# Patient Record
Sex: Female | Born: 1975 | Race: White | Hispanic: No | Marital: Single | Smoking: Current some day smoker
Health system: Southern US, Community
[De-identification: ages and names within clinical notes are randomized; demographics above are authoritative.]

## PROBLEM LIST (undated history)

## (undated) HISTORY — PX: TUBAL LIGATION: SHX77

---

## 2020-03-10 ENCOUNTER — Encounter (HOSPITAL_COMMUNITY): Payer: Self-pay | Admitting: Family Medicine

## 2020-03-10 ENCOUNTER — Ambulatory Visit (HOSPITAL_COMMUNITY)
Admission: EM | Admit: 2020-03-10 | Discharge: 2020-03-10 | Disposition: A | Discharge status: Home / Self Care | Payer: Self-pay | Attending: Family Medicine | Admitting: Family Medicine

## 2020-03-10 ENCOUNTER — Other Ambulatory Visit: Payer: Self-pay

## 2020-03-10 DIAGNOSIS — H6122 Impacted cerumen, left ear: Secondary | ICD-10-CM

## 2020-03-10 DIAGNOSIS — F419 Anxiety disorder, unspecified: Secondary | ICD-10-CM

## 2020-03-10 DIAGNOSIS — N83209 Unspecified ovarian cyst, unspecified side: Secondary | ICD-10-CM

## 2020-03-10 MED ORDER — HYDROXYZINE HCL 25 MG PO TABS: 25.0000 mg | ORAL_TABLET | Freq: Four times a day (QID) | ORAL | 3 refills | Status: AC | PRN

## 2020-03-10 MED ORDER — PAROXETINE HCL 20 MG PO TABS: 20.0000 mg | ORAL_TABLET | Freq: Every evening | ORAL | 3 refills | Status: AC

## 2020-03-10 NOTE — ED Triage Notes (Signed)
Pt states her left ear is stopped up. Pt states she needs a prescription for her anxiety. Pt needs a recommendation for a OBGYN. Pt states she's new here in [place].

## 2020-03-10 NOTE — ED Provider Notes (Signed)
[facility]    CSN: [id number] Arrival date & time: 03/10/20  1719      History   Chief Complaint Chief Complaint  Patient presents with  . Otalgia    HPI Mariah Collier is a 44 y.o. female.   Initial [facility] visit for this 44 yo woman with earache and hearing loss on left.  Pt states her left ear is stopped up. Pt states she needs a prescription for her anxiety. She's been on multiple meds in the past including antipsychotics, Buspar, hydroxyzine and SSRI's.  She did best on Buspar and Paxil, but the former gave her swollen lips.   Pt needs a recommendation for a OBGYN (on Medicaid) for a 10 cm ovarian cyst.     Pt states she's new here in [place], having moved from [place].  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  No family members for support.  Living in $300 per month apartment.     History reviewed. No pertinent past medical history.  There are no problems to display for this patient.   Past Surgical History:  Procedure Laterality Date  . TUBAL LIGATION      OB History   No obstetric history on file.      Home Medications    Prior to Admission medications   Medication Sig Start Date End Date Taking? Authorizing Provider  hydrOXYzine (ATARAX/VISTARIL) 25 MG tablet Take 1 tablet (25 mg total) by mouth every 6 (six) hours as needed. 03/10/20   Elvina Sidle, MD  PARoxetine (PAXIL) 20 MG tablet Take 1 tablet (20 mg total) by mouth at bedtime. 03/10/20   Elvina Sidle, MD    Family History Family History  Problem Relation Age of Onset  . Stroke Mother   . COPD Mother   . Heart failure Mother   . Diabetes Father   . Stroke Father   . Hypertension Father     Social History Social History   Tobacco Use  . Smoking status: Never Smoker  . Smokeless tobacco: Never Used  Substance Use Topics  . Alcohol use: Not on file  . Drug use: Not on file      Allergies   Patient has no known allergies.   Review of Systems Review of Systems   Physical Exam Triage Vital Signs ED Triage Vitals [03/10/20 1745]  Enc Vitals Group     BP      Pulse      Resp      Temp      Temp src      SpO2      Weight 204 lb (92.5 kg)     Height      Head Circumference      Peak Flow      Pain Score 0     Pain Loc      Pain Edu?      Excl. in GC?    No data found.  Updated Vital Signs BP 118/75 (BP Location: Right Arm)   Pulse 90   Temp 98.4 F (36.9 C) (Oral)   Resp 18   Wt 92.5 kg   LMP 02/29/2020   SpO2 99%    Physical Exam Vitals and nursing note reviewed.  Constitutional:      Appearance: Normal appearance. She is obese.  HENT:     Head: Normocephalic.     Left Ear: There is  impacted cerumen.     Mouth/Throat:     Pharynx: Oropharynx is clear.  Eyes:     Conjunctiva/sclera: Conjunctivae normal.  Cardiovascular:     Rate and Rhythm: Normal rate.  Pulmonary:     Effort: Pulmonary effort is normal.  Musculoskeletal:        General: Normal range of motion.     Cervical back: Normal range of motion.  Skin:    General: Skin is warm and dry.  Neurological:     General: No focal deficit present.     Mental Status: She is alert and oriented to person, place, and time.  Psychiatric:        Behavior: Behavior normal.        Thought Content: Thought content normal.     Comments: Rapid speech      UC Treatments / Results  Labs (all labs ordered are listed, but only abnormal results are displayed) Labs Reviewed - No data to display  EKG   Radiology No results found.  Procedures Procedures (including critical care time)  Medications Ordered in UC Medications - No data to display  Initial Impression / Assessment and Plan / UC Course  I have reviewed the triage vital signs and the nursing notes.  Pertinent labs & imaging results that were available during my care of the patient were reviewed by me and  considered in my medical decision making (see chart for details).    Final Clinical Impressions(s) / UC Diagnoses   Final diagnoses:  Hearing loss due to cerumen impaction, left  Chronic anxiety  Cyst of ovary, unspecified laterality   Discharge Instructions   None    ED Prescriptions    Medication Sig Dispense Auth. Provider   PARoxetine (PAXIL) 20 MG tablet Take 1 tablet (20 mg total) by mouth at bedtime. 90 tablet Robyn Haber, MD   hydrOXYzine (ATARAX/VISTARIL) 25 MG tablet Take 1 tablet (25 mg total) by mouth every 6 (six) hours as needed. 360 tablet Robyn Haber, MD     I have reviewed the PDMP during this encounter.   Robyn Haber, MD 03/10/20 [phone]

## 2020-06-05 ENCOUNTER — Encounter (HOSPITAL_COMMUNITY): Payer: Self-pay | Admitting: *Deleted

## 2020-06-05 ENCOUNTER — Other Ambulatory Visit: Payer: Self-pay

## 2020-06-05 ENCOUNTER — Emergency Department (HOSPITAL_COMMUNITY): Payer: Self-pay

## 2020-06-05 ENCOUNTER — Emergency Department (HOSPITAL_COMMUNITY)
Admission: EM | Admit: 2020-06-05 | Discharge: 2020-06-06 | Disposition: A | Discharge status: Home / Self Care | Payer: Self-pay | Attending: Emergency Medicine | Admitting: Emergency Medicine

## 2020-06-05 DIAGNOSIS — F17210 Nicotine dependence, cigarettes, uncomplicated: Secondary | ICD-10-CM | POA: Insufficient documentation

## 2020-06-05 DIAGNOSIS — J3489 Other specified disorders of nose and nasal sinuses: Secondary | ICD-10-CM | POA: Insufficient documentation

## 2020-06-05 DIAGNOSIS — Z20822 Contact with and (suspected) exposure to covid-19: Secondary | ICD-10-CM | POA: Insufficient documentation

## 2020-06-05 DIAGNOSIS — R590 Localized enlarged lymph nodes: Secondary | ICD-10-CM | POA: Insufficient documentation

## 2020-06-05 DIAGNOSIS — R202 Paresthesia of skin: Secondary | ICD-10-CM

## 2020-06-05 DIAGNOSIS — R000 Tachycardia, unspecified: Secondary | ICD-10-CM | POA: Insufficient documentation

## 2020-06-05 DIAGNOSIS — R0981 Nasal congestion: Secondary | ICD-10-CM

## 2020-06-05 DIAGNOSIS — R05 Cough: Secondary | ICD-10-CM | POA: Insufficient documentation

## 2020-06-05 DIAGNOSIS — R519 Headache, unspecified: Secondary | ICD-10-CM | POA: Insufficient documentation

## 2020-06-05 DIAGNOSIS — R509 Fever, unspecified: Secondary | ICD-10-CM

## 2020-06-05 LAB — CBC WITH DIFFERENTIAL/PLATELET
Abs Immature Granulocytes: 0.01 10*3/uL (ref 0.00–0.07)
Basophils Absolute: 0 10*3/uL (ref 0.0–0.1)
Basophils Relative: 1 %
Eosinophils Absolute: 0 10*3/uL (ref 0.0–0.5)
Eosinophils Relative: 0 %
HCT: 45.3 % (ref 36.0–46.0)
Hemoglobin: 14.7 g/dL (ref 12.0–15.0)
Immature Granulocytes: 0 %
Lymphocytes Relative: 22 %
Lymphs Abs: 0.5 10*3/uL — ABNORMAL LOW (ref 0.7–4.0)
MCH: 30.1 pg (ref 26.0–34.0)
MCHC: 32.5 g/dL (ref 30.0–36.0)
MCV: 92.6 fL (ref 80.0–100.0)
Monocytes Absolute: 0.1 10*3/uL (ref 0.1–1.0)
Monocytes Relative: 5 %
Neutro Abs: 1.6 10*3/uL — ABNORMAL LOW (ref 1.7–7.7)
Neutrophils Relative %: 72 %
Platelets: 163 10*3/uL (ref 150–400)
RBC: 4.89 MIL/uL (ref 3.87–5.11)
RDW: 13.4 % (ref 11.5–15.5)
WBC: 2.2 10*3/uL — ABNORMAL LOW (ref 4.0–10.5)
nRBC: 0 % (ref 0.0–0.2)

## 2020-06-05 LAB — URINALYSIS, ROUTINE W REFLEX MICROSCOPIC
Bilirubin Urine: NEGATIVE
Glucose, UA: NEGATIVE mg/dL
Ketones, ur: NEGATIVE mg/dL
Nitrite: NEGATIVE
Protein, ur: 100 mg/dL — AB
Specific Gravity, Urine: 1.029 (ref 1.005–1.030)
pH: 5 (ref 5.0–8.0)

## 2020-06-05 LAB — LACTIC ACID, PLASMA
Lactic Acid, Venous: 2.1 mmol/L (ref 0.5–1.9)
Lactic Acid, Venous: 2.2 mmol/L (ref 0.5–1.9)

## 2020-06-05 LAB — BASIC METABOLIC PANEL
Anion gap: 10 (ref 5–15)
BUN: 13 mg/dL (ref 6–20)
CO2: 26 mmol/L (ref 22–32)
Calcium: 8.5 mg/dL — ABNORMAL LOW (ref 8.9–10.3)
Chloride: 100 mmol/L (ref 98–111)
Creatinine, Ser: 1.07 mg/dL — ABNORMAL HIGH (ref 0.44–1.00)
GFR calc Af Amer: >60 mL/min (ref >60)
GFR calc non Af Amer: >60 mL/min (ref >60)
Glucose, Bld: 106 mg/dL — ABNORMAL HIGH (ref 70–99)
Potassium: 3.4 mmol/L — ABNORMAL LOW (ref 3.5–5.1)
Sodium: 136 mmol/L (ref 135–145)

## 2020-06-05 LAB — I-STAT BETA HCG BLOOD, ED (NOT ORDERABLE): I-stat hCG, quantitative: <5 m[IU]/mL (ref <5)

## 2020-06-05 LAB — SEDIMENTATION RATE: Sed Rate: 30 mm/hr — ABNORMAL HIGH (ref 0–22)

## 2020-06-05 MED ORDER — IOHEXOL 300 MG/ML  SOLN
75.0000 mL | Freq: Once | INTRAMUSCULAR | Status: AC | PRN
  Administered 2020-06-05: 75 mL via INTRAVENOUS

## 2020-06-05 MED ORDER — SODIUM CHLORIDE 0.9 % IV BOLUS
1000.0000 mL | Freq: Once | INTRAVENOUS | Status: AC
  Administered 2020-06-06: 1000 mL via INTRAVENOUS

## 2020-06-05 MED ORDER — SODIUM CHLORIDE 0.9 % IV SOLN
2.0000 g | Freq: Once | INTRAVENOUS | Status: AC
  Administered 2020-06-06: 2 g via INTRAVENOUS
  Filled 2020-06-05: qty 2

## 2020-06-05 MED ORDER — LORAZEPAM 1 MG PO TABS
1.0000 mg | ORAL_TABLET | Freq: Once | ORAL | Status: AC
  Administered 2020-06-06: 1 mg via ORAL
  Filled 2020-06-05: qty 1

## 2020-06-05 MED ORDER — SODIUM CHLORIDE 0.9 % IV BOLUS
1000.0000 mL | Freq: Once | INTRAVENOUS | Status: AC
  Administered 2020-06-05: 1000 mL via INTRAVENOUS

## 2020-06-05 MED ORDER — SODIUM CHLORIDE (PF) 0.9 % IJ SOLN
INTRAMUSCULAR | Status: AC
  Administered 2020-06-06: 1 mL
  Filled 2020-06-05: qty 50

## 2020-06-05 MED ORDER — VANCOMYCIN HCL IN DEXTROSE 1-5 GM/200ML-% IV SOLN
1000.0000 mg | Freq: Once | INTRAVENOUS | Status: AC
  Administered 2020-06-06: 1000 mg via INTRAVENOUS
  Filled 2020-06-05: qty 200

## 2020-06-05 MED ORDER — ACETAMINOPHEN 325 MG PO TABS
650.0000 mg | ORAL_TABLET | Freq: Once | ORAL | Status: AC | PRN
  Administered 2020-06-05: 650 mg via ORAL
  Filled 2020-06-05: qty 2

## 2020-06-05 MED ORDER — ACETAMINOPHEN 500 MG PO TABS
1000.0000 mg | ORAL_TABLET | Freq: Once | ORAL | Status: AC
  Administered 2020-06-05: 1000 mg via ORAL
  Filled 2020-06-05: qty 2

## 2020-06-05 MED ORDER — POTASSIUM CHLORIDE CRYS ER 20 MEQ PO TBCR
40.0000 meq | EXTENDED_RELEASE_TABLET | Freq: Once | ORAL | Status: AC
  Administered 2020-06-06: 40 meq via ORAL
  Filled 2020-06-05: qty 2

## 2020-06-05 NOTE — ED Provider Notes (Signed)
[facility]-EMERGENCY DEPT Provider Note   CSN: [id number] Arrival date & time: 06/05/20  1530     History Chief Complaint  Patient presents with  . Lymphadenopathy  . Headache  . Cough    Mariah Collier is a 44 y.o. female.  Patient c/o fever in past day. Symptoms acute onset, moderate, persistent. Also c/o noting swollen lymph nodes in left axillary, currently improved. Also notes sinus congestion, and sinus pressure sensation and c/o 'sinus headache'. No acute, abrupt or severe headaches. C/o general malaise. +non prod cough. No sore throat. Denies neck pain or stiffness. No back pain. Denies chest pain or sob. No abd pain or nvd. No dysuria or gu c/o. No skin lesions/rash. No known ill contacts or known covid exposure. Also is c/o anxiety, and hx same. States had tingling of bil hands a few minutes ago, currently improving. No weakness.   The history is provided by the patient.  Shortness of Breath Associated symptoms: cough, fever and headaches   Associated symptoms: no abdominal pain, no chest pain, no neck pain, no rash, no sore throat and no vomiting   Headache Associated symptoms: cough, fever and sinus pressure   Associated symptoms: no abdominal pain, no back pain, no diarrhea, no neck pain, no sore throat, no vomiting and no weakness        History reviewed. No pertinent past medical history.  There are no problems to display for this patient.   Past Surgical History:  Procedure Laterality Date  . TUBAL LIGATION       OB History   No obstetric history on file.     Family History  Problem Relation Age of Onset  . Stroke Mother   . COPD Mother   . Heart failure Mother   . Diabetes Father   . Stroke Father   . Hypertension Father     Social History   Tobacco Use  . Smoking status: Current Some Day Smoker  . Smokeless tobacco: Never Used  Substance Use Topics  . Alcohol use: Never  . Drug use: Never    Home  Medications Prior to Admission medications   Medication Sig Start Date End Date Taking? Authorizing Provider  dimenhyDRINATE (DRAMAMINE) 50 MG tablet Take 50 mg by mouth at bedtime as needed (sleep).   Yes [provider]  diphenhydrAMINE (BENADRYL) 25 mg capsule Take 25 mg by mouth at bedtime as needed for sleep.   Yes [provider]  hydrOXYzine (ATARAX/VISTARIL) 25 MG tablet Take 1 tablet (25 mg total) by mouth every 6 (six) hours as needed. Patient taking differently: Take 25 mg by mouth at bedtime.  03/10/20  Yes Elvina Sidle, MD  OVER THE COUNTER MEDICATION Take 4-6 capsules by mouth at bedtime. "Mimosa", natural sedative   Yes [provider]  OVER THE COUNTER MEDICATION Take 3-4 tablets by mouth at bedtime as needed (sleep aid). Nature's Bounty Sleep 3 with stress support   Yes [provider]  PARoxetine (PAXIL) 20 MG tablet Take 1 tablet (20 mg total) by mouth at bedtime. 03/10/20  Yes Elvina Sidle, MD    Allergies    Noxi-k [place] treatment-moisturizer]  Review of Systems   Review of Systems  Constitutional: Positive for fever.  HENT: Positive for rhinorrhea and sinus pressure. Negative for sore throat.   Eyes: Negative for redness.  Respiratory: Positive for cough.   Cardiovascular: Negative for chest pain.  Gastrointestinal: Negative for abdominal pain, diarrhea and vomiting.  Genitourinary: Negative for  dysuria and flank pain.  Musculoskeletal: Negative for back pain and neck pain.  Skin: Negative for rash.  Neurological: Positive for headaches. Negative for weakness.  Hematological: Does not bruise/bleed easily.  Psychiatric/Behavioral: Negative for confusion.    Physical Exam Updated Vital Signs BP (!) 121/57 (BP Location: Left Arm)   Pulse (!) 105   Temp 99.6 F (37.6 C) (Oral)   Resp 20   Ht 1.6 m (5\' 3" )   Wt 99.8 kg   LMP 05/06/2020   SpO2 100%   BMI 38.97 kg/m   Physical Exam Vitals and nursing note  reviewed.  Constitutional:      Appearance: Normal appearance. She is well-developed.  HENT:     Head: Atraumatic.     Right Ear: Tympanic membrane normal.     Left Ear: Tympanic membrane normal.     Nose: Congestion present.     Mouth/Throat:     Mouth: Mucous membranes are moist.  Eyes:     General: No scleral icterus.    Conjunctiva/sclera: Conjunctivae normal.     Pupils: Pupils are equal, round, and reactive to light.  Neck:     Trachea: No tracheal deviation.  Cardiovascular:     Rate and Rhythm: Regular rhythm. Tachycardia present.     Pulses: Normal pulses.     Heart sounds: Normal heart sounds. No murmur heard.  No friction rub. No gallop.   Pulmonary:     Effort: Pulmonary effort is normal. No respiratory distress.     Breath sounds: Normal breath sounds.  Abdominal:     General: Bowel sounds are normal. There is no distension.     Palpations: Abdomen is soft. There is no mass.     Tenderness: There is no abdominal tenderness. There is no guarding or rebound.     Hernia: No hernia is present.  Genitourinary:    Comments: No cva tenderness.  Musculoskeletal:        General: No swelling.     Cervical back: Normal range of motion and neck supple. No rigidity or tenderness. No muscular tenderness.     Comments: CTLS spine, non tender, aligned, no step off. Good rom bil ext without pain or focal bony tenderness.  Left axilla without appreciable l/a, no abscess. No extremity lesions or cellulitis.   Lymphadenopathy:     Cervical: No cervical adenopathy.  Skin:    General: Skin is warm and dry.     Findings: No rash.  Neurological:     Mental Status: She is alert.     Comments: Alert, speech normal. Motor/sens grossly intact bil. Steady gait.   Psychiatric:     Comments: Anxious appearing.      ED Results / Procedures / Treatments   Labs (all labs ordered are listed, but only abnormal results are displayed) Results for orders placed or performed during the  hospital encounter of 06/05/20  Basic metabolic panel  Result Value Ref Range   Sodium 136 135 - 145 mmol/L   Potassium 3.4 (L) 3.5 - 5.1 mmol/L   Chloride 100 98 - 111 mmol/L   CO2 26 22 - 32 mmol/L   Glucose, Bld 106 (H) 70 - 99 mg/dL   BUN 13 6 - 20 mg/dL   Creatinine, Ser 06/07/20 (H) 0.44 - 1.00 mg/dL   Calcium 8.5 (L) 8.9 - 10.3 mg/dL   GFR calc non Af Amer >60 >60 mL/min   GFR calc Af Amer >60 >60 mL/min   Anion gap 10 5 -  15  CBC with Differential  Result Value Ref Range   WBC 2.2 (L) 4.0 - 10.5 K/uL   RBC 4.89 3.87 - 5.11 MIL/uL   Hemoglobin 14.7 12.0 - 15.0 g/dL   HCT 45.3 36 - 46 %   MCV 92.6 80.0 - 100.0 fL   MCH 30.1 26.0 - 34.0 pg   MCHC 32.5 30.0 - 36.0 g/dL   RDW 13.4 11.5 - 15.5 %   Platelets 163 150 - 400 K/uL   nRBC 0.0 0.0 - 0.2 %   Neutrophils Relative % 72 %   Neutro Abs 1.6 (L) 1.7 - 7.7 K/uL   Lymphocytes Relative 22 %   Lymphs Abs 0.5 (L) 0.7 - 4.0 K/uL   Monocytes Relative 5 %   Monocytes Absolute 0.1 0 - 1 K/uL   Eosinophils Relative 0 %   Eosinophils Absolute 0.0 0 - 0 K/uL   Basophils Relative 1 %   Basophils Absolute 0.0 0 - 0 K/uL   Immature Granulocytes 0 %   Abs Immature Granulocytes 0.01 0.00 - 0.07 K/uL  Lactic acid, plasma  Result Value Ref Range   Lactic Acid, Venous 2.1 (HH) 0.5 - 1.9 mmol/L  Urinalysis, Routine w reflex microscopic  Result Value Ref Range   Color, Urine AMBER (A) YELLOW   APPearance HAZY (A) CLEAR   Specific Gravity, Urine 1.029 1.005 - 1.030   pH 5.0 5.0 - 8.0   Glucose, UA NEGATIVE NEGATIVE mg/dL   Hgb urine dipstick SMALL (A) NEGATIVE   Bilirubin Urine NEGATIVE NEGATIVE   Ketones, ur NEGATIVE NEGATIVE mg/dL   Protein, ur 100 (A) NEGATIVE mg/dL   Nitrite NEGATIVE NEGATIVE   Leukocytes,Ua MODERATE (A) NEGATIVE   RBC / HPF 6-10 0 - 5 RBC/hpf   WBC, UA 6-10 0 - 5 WBC/hpf   Bacteria, UA RARE (A) NONE SEEN   Squamous Epithelial / LPF 11-20 0 - 5   Mucus PRESENT   I-Stat beta hCG blood, ED  Result Value Ref  Range   I-stat hCG, quantitative <5.0 <5 mIU/mL   Comment 3           DG Chest 2 View  Result Date: 06/05/2020 CLINICAL DATA:  Shortness of breath and right arm tingling. EXAM: CHEST - 2 VIEW COMPARISON:  None. FINDINGS: Decreased lung volumes are seen which is likely secondary to the degree of patient inspiration. Subsequent vascular crowding is noted. There is no evidence of acute infiltrate, pleural effusion or pneumothorax. The heart size and mediastinal contours are within normal limits. The visualized skeletal structures are unremarkable. IMPRESSION: No active cardiopulmonary disease. Electronically Signed   By: Virgina Norfolk M.D.   On: 06/05/2020 16:18    EKG None  Radiology DG Chest 2 View  Result Date: 06/05/2020 CLINICAL DATA:  Shortness of breath and right arm tingling. EXAM: CHEST - 2 VIEW COMPARISON:  None. FINDINGS: Decreased lung volumes are seen which is likely secondary to the degree of patient inspiration. Subsequent vascular crowding is noted. There is no evidence of acute infiltrate, pleural effusion or pneumothorax. The heart size and mediastinal contours are within normal limits. The visualized skeletal structures are unremarkable. IMPRESSION: No active cardiopulmonary disease. Electronically Signed   By: Virgina Norfolk M.D.   On: 06/05/2020 16:18    Procedures Procedures (including critical care time)  Medications Ordered in ED Medications  sodium chloride 0.9 % bolus 1,000 mL (has no administration in time range)  acetaminophen (TYLENOL) tablet 650 mg (650 mg Oral Given  06/05/20 1550)    ED Course  I have reviewed the triage vital signs and the nursing notes.  Pertinent labs & imaging results that were available during my care of the patient were reviewed by me and considered in my medical decision making (see chart for details).    MDM Rules/Calculators/A&P                          Iv ns. Labs. Cultures. Post cultures, dose of iv abx,   Reviewed  nursing notes and prior charts for additional history.   Patient requests med for anxiety, stating hx same. Ativan po.   Acetaminophen for fever.   Initial labs reviewed/interpreted by me - initial lactate sl high, ns bolus.   Wbc low, neutrophils 1.6.   Patient receiving ivf. Repeat lactate pending.   Pt now indicates some right arm tingling for past few days. No weakness. No arm or shoulder pain. No radicular pain. No swelling to arm, no skin changes or erythema. No neck pain. Given fever, right arm tingling/numbness, will get ct imaging r/o epidural abscess/disciitis (MRI not currently available here).   Source of fever remains unclear. Pt does have nasal congestion, and notes sinus congestion/pressure. Also w c/o recent left axillary l/a, although currently unremarkable exam of area.  Signed out to Dr Charline Bills Lynelle Doctor to check ct results, recheck pt, pending labs, and dispo appropriately.     Final Clinical Impression(s) / ED Diagnoses Final diagnoses:  None    Rx / DC Orders ED Discharge Orders    None       Cathren Laine, MD 06/05/20 2316

## 2020-06-05 NOTE — ED Provider Notes (Addendum)
Patient left at change of shift to check the results of her CT scan of the cervical spine with contrast.  Dr. Madaline Guthrie reports patient is complaint of tingling in her right arm and swelling in her left axilla were alleviated by getting benzodiazepines.  On review of her lab work her first lactic acid was minimally elevated at 2.1, her total white blood cell count is 2.2 without absolute neutropenia.  She has received 2000 cc of normal saline IV fluids and IV Maxipime and vancomycin when she first arrived.  Her fever has been treated twice with acetaminophen.  1:20 AM I talked to the patient.  She is noted to be moving her head freely without apparent neck discomfort.  When I asked her how she is feeling she states she "feels weird".  However she states she does not normally stay up to sleep.  When I asked her how she is feeling now compared to the way she felt when she arrived in the ED she states that her symptoms are better.  She denies having a dry mouth, she states she has been drinking water in the ED without difficulty.  When I asked her about having urinary output she states she always has frequent urination.  We discussed her test results.  More than likely she has a viral illness but she will be discharged home with Augmentin.  She asked if she should work on Monday, June 21 and we discussed if she has a fever she should not.  She works for a [employer].  Patient still has 1 almost full bag still hanging, she is getting her IV antibiotics.  3:45 AM nursing staff did not let me know patient had already finished her IV fluids.  I was getting ready to discharge patient however I noted that her respiratory rate has consistently been around 30.  CTA of the chest was done to rule out PE.  6:15 AM patient CT has resulted, there appears to be some increased interstitial fluid.  When I go back to examine patient she has no pitting edema of her lower extremities.  I will give her a one-time dose of  Lasix and let her go home.  She asked me if she should be able to go to work tomorrow and I told her she has a fever she should not go to work.  We discussed she did have some swollen lymph nodes in her left axilla.  The etiology right now is not clear.  Patient states she has difficulty with medical care and follow-up.  Dr. Madaline Guthrie had wanted to put her on Augmentin when she was discharged however I feel like she probably would not afford that, she was given amoxicillin.   Results for orders placed or performed during the hospital encounter of 06/05/20  SARS Coronavirus 2 by RT PCR (hospital order, performed in [facility] hospital lab) Nasopharyngeal Nasopharyngeal Swab   Specimen: Nasopharyngeal Swab  Result Value Ref Range   SARS Coronavirus 2 NEGATIVE NEGATIVE  Basic metabolic panel  Result Value Ref Range   Sodium 136 135 - 145 mmol/L   Potassium 3.4 (L) 3.5 - 5.1 mmol/L   Chloride 100 98 - 111 mmol/L   CO2 26 22 - 32 mmol/L   Glucose, Bld 106 (H) 70 - 99 mg/dL   BUN 13 6 - 20 mg/dL   Creatinine, Ser 2.45 (H) 0.44 - 1.00 mg/dL   Calcium 8.5 (L) 8.9 - 10.3 mg/dL   GFR calc non Af Amer >  60 >60 mL/min   GFR calc Af Amer >60 >60 mL/min   Anion gap 10 5 - 15  CBC with Differential  Result Value Ref Range   WBC 2.2 (L) 4.0 - 10.5 K/uL   RBC 4.89 3.87 - 5.11 MIL/uL   Hemoglobin 14.7 12.0 - 15.0 g/dL   HCT 45.3 36 - 46 %   MCV 92.6 80.0 - 100.0 fL   MCH 30.1 26.0 - 34.0 pg   MCHC 32.5 30.0 - 36.0 g/dL   RDW 13.4 11.5 - 15.5 %   Platelets 163 150 - 400 K/uL   nRBC 0.0 0.0 - 0.2 %   Neutrophils Relative % 72 %   Neutro Abs 1.6 (L) 1.7 - 7.7 K/uL   Lymphocytes Relative 22 %   Lymphs Abs 0.5 (L) 0.7 - 4.0 K/uL   Monocytes Relative 5 %   Monocytes Absolute 0.1 0 - 1 K/uL   Eosinophils Relative 0 %   Eosinophils Absolute 0.0 0 - 0 K/uL   Basophils Relative 1 %   Basophils Absolute 0.0 0 - 0 K/uL   Immature Granulocytes 0 %   Abs Immature Granulocytes 0.01 0.00 - 0.07 K/uL  Lactic  acid, plasma  Result Value Ref Range   Lactic Acid, Venous 2.1 (HH) 0.5 - 1.9 mmol/L  Lactic acid, plasma  Result Value Ref Range   Lactic Acid, Venous 2.2 (HH) 0.5 - 1.9 mmol/L  Urinalysis, Routine w reflex microscopic  Result Value Ref Range   Color, Urine AMBER (A) YELLOW   APPearance HAZY (A) CLEAR   Specific Gravity, Urine 1.029 1.005 - 1.030   pH 5.0 5.0 - 8.0   Glucose, UA NEGATIVE NEGATIVE mg/dL   Hgb urine dipstick SMALL (A) NEGATIVE   Bilirubin Urine NEGATIVE NEGATIVE   Ketones, ur NEGATIVE NEGATIVE mg/dL   Protein, ur 100 (A) NEGATIVE mg/dL   Nitrite NEGATIVE NEGATIVE   Leukocytes,Ua MODERATE (A) NEGATIVE   RBC / HPF 6-10 0 - 5 RBC/hpf   WBC, UA 6-10 0 - 5 WBC/hpf   Bacteria, UA RARE (A) NONE SEEN   Squamous Epithelial / LPF 11-20 0 - 5   Mucus PRESENT   Sedimentation rate  Result Value Ref Range   Sed Rate 30 (H) 0 - 22 mm/hr  I-Stat beta hCG blood, ED  Result Value Ref Range   I-stat hCG, quantitative <5.0 <5 mIU/mL   Comment 3            Laboratory interpretation all normal except mildly elevated lactic acid, low total white blood cell count consistent with a viral infection.  UA is contaminated.   DG Chest 2 View  Result Date: 06/05/2020 CLINICAL DATA:  Shortness of breath and right arm tingling. EXAM: CHEST - 2 VIEW COMPARISON:  None. FINDINGS: Decreased lung volumes are seen which is likely secondary to the degree of patient inspiration. Subsequent vascular crowding is noted. There is no evidence of acute infiltrate, pleural effusion or pneumothorax. The heart size and mediastinal contours are within normal limits. The visualized skeletal structures are unremarkable. IMPRESSION: No active cardiopulmonary disease. Electronically Signed   By: Virgina Norfolk M.D.   On: 06/05/2020 16:18   CT CERVICAL SPINE W WO CONTRAST  Result Date: 06/05/2020 CLINICAL DATA:  Right arm tingling and pain EXAM: CT CERVICAL SPINE with and without CONTRAST TECHNIQUE:  Multidetector CT imaging of the cervical spine was performed with and without intravenous contrast. Multiplanar CT image reconstructions were also generated. COMPARISON:  None. FINDINGS:  Alignment: There is straightening of the normal cervical lordosis. Skull base and vertebrae: Visualized skull base is intact. No atlanto-occipital dissociation. The vertebral body heights are well maintained. No fracture or pathologic osseous lesion seen. Soft tissues and spinal canal: The visualized paraspinal soft tissues are unremarkable. No prevertebral soft tissue swelling is seen. The spinal canal is grossly unremarkable, no large epidural collection or significant canal narrowing. Disc levels: No significant canal or neural foraminal narrowing. Upper chest: The lung apices are clear. Thoracic inlet is within normal limits. Other: None IMPRESSION: No definite acute osseous or soft tissue abnormality seen. If further evaluation is required would recommend MRI. Electronically Signed   By: Jonna Clark M.D.   On: 06/05/2020 23:24    CT Angio Chest PE W/Cm &/Or Wo Cm  Result Date: 06/06/2020 CLINICAL DATA:  Tachycardia. Shob, rt arm tingling, left arm swollen lymph nodes swollen, headache, generally not feeling well. Fever in triage. Tachycardia Pt had a prior scan with iv contrast (75cc) at 10 pm last night. ^116mL OMNIPAQUE IOHEXOL 350 MG/ML SOLNShortness of breath EXAM: CT ANGIOGRAPHY CHEST WITH CONTRAST TECHNIQUE: Multidetector CT imaging of the chest was performed using the standard protocol during bolus administration of intravenous contrast. Multiplanar CT image reconstructions and MIPs were obtained to evaluate the vascular anatomy. CONTRAST:  OMNIPAQUE IOHEXOL 350 MG/ML SOLN COMPARISON:  None. FINDINGS: Cardiovascular: Exam is degraded by patient respiratory motion as well as body habitus. No filling defects within the main pulmonary arteries or proximal segmental pulmonary arteries. The distal segmental  pulmonary arteries and subsegmental arteries are not evaluable. No pericardial fluid. Mediastinum/Nodes: Cluster of prominent LEFT axillary lymph nodes. No mediastinal adenopathy. Lungs/Pleura: Interstitial edema pattern. No infiltrate. No pulmonary infarction. Upper Abdomen: Limited view of the liver, kidneys, pancreas are unremarkable. Normal adrenal glands. Musculoskeletal: No aggressive osseous lesion. Review of the MIP images confirms the above findings. IMPRESSION: 1. No evidence acute pulmonary embolism in the main pulmonary arteries or proximal segmental pulmonary arteries. The distal segmental pulmonary arteries and subsegmental arteries are not evaluable. 2. Interstitial edema pattern. 3. Cluster of prominent LEFT axillary lymph nodes with indeterminate etiology. Query recent COVID vaccination. Electronically Signed   By: Genevive Bi M.D.   On: 06/06/2020 05:56   Diagnoses that have been ruled out:  None  Diagnoses that are still under consideration:  None  Final diagnoses:  Acute febrile illness  Sinus congestion  Paresthesia   ED Discharge Orders         Ordered    amoxicillin (AMOXIL) 500 MG capsule  3 times daily     Discontinue  Reprint     06/06/20 9381         Plan discharge  Mariah Albe, MD, Concha Pyo, MD 06/06/20 0175    Mariah Albe, MD 06/06/20 [phone]

## 2020-06-05 NOTE — Discharge Instructions (Addendum)
It was our pleasure to provide your ER care today - we hope that you feel better.  Take acetaminophen or ibuprofen as need.  Take augmentin (antibiotic) as prescribed.   Follow up with primary care doctor Monday for recheck.  Return to ER if worse, new symptoms, trouble breathing, new or severe pain, numbness/weakness, or other concern.

## 2020-06-05 NOTE — ED Triage Notes (Addendum)
Shob, rt arm tingling, left arm swollen lymph nodes swollen, headache, generally not feeling well. Fever in triage. Tachycardia   Pt states she prefers not to answer if she is Suicidal or homicidal. She finally said no after I told her I have to have an answer.

## 2020-06-06 ENCOUNTER — Encounter (HOSPITAL_COMMUNITY): Payer: Self-pay

## 2020-06-06 ENCOUNTER — Emergency Department (HOSPITAL_COMMUNITY): Payer: Self-pay

## 2020-06-06 LAB — SARS CORONAVIRUS 2 BY RT PCR (HOSPITAL ORDER, PERFORMED IN ~~LOC~~ HOSPITAL LAB): SARS Coronavirus 2: NEGATIVE

## 2020-06-06 MED ORDER — AMOXICILLIN 500 MG PO CAPS: 500.0000 mg | ORAL_CAPSULE | Freq: Three times a day (TID) | ORAL | 0 refills | Status: AC

## 2020-06-06 MED ORDER — IOHEXOL 350 MG/ML SOLN
100.0000 mL | Freq: Once | INTRAVENOUS | Status: AC | PRN
  Administered 2020-06-06: 100 mL via INTRAVENOUS

## 2020-06-06 MED ORDER — FUROSEMIDE 10 MG/ML IJ SOLN
60.0000 mg | Freq: Once | INTRAMUSCULAR | Status: AC
  Administered 2020-06-06: 60 mg via INTRAVENOUS
  Filled 2020-06-06: qty 8

## 2020-06-06 MED ORDER — SODIUM CHLORIDE 0.9 % IV BOLUS
1000.0000 mL | Freq: Once | INTRAVENOUS | Status: AC
  Administered 2020-06-06: 1000 mL via INTRAVENOUS

## 2021-02-02 IMAGING — CR DG CHEST 2V
2 series · 2 of 2 positions shown · non-contrast
Comparison: None.

CLINICAL DATA: Shortness of breath and right arm tingling.

EXAM:
CHEST - 2 VIEW

[w chest pa]
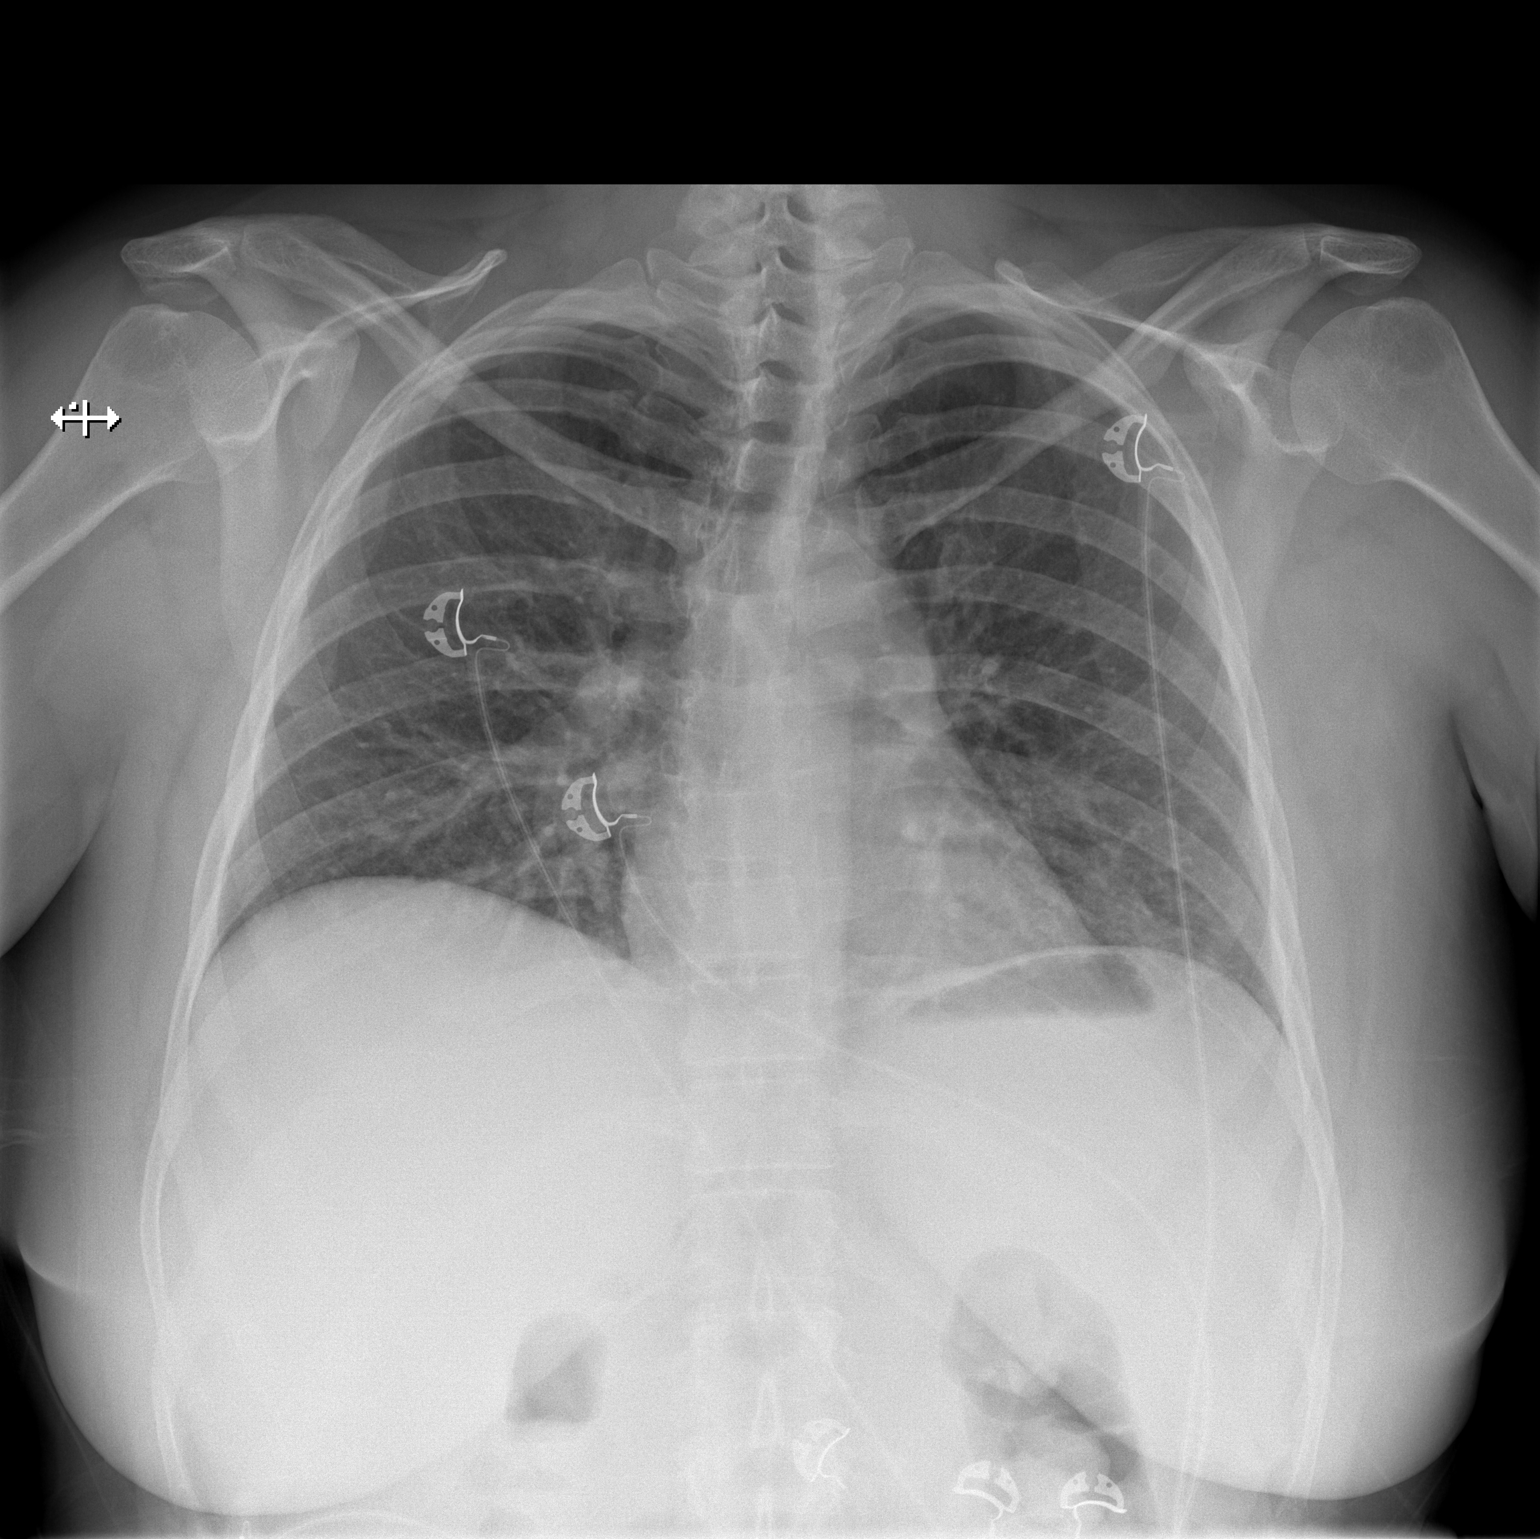

[w chest lat]
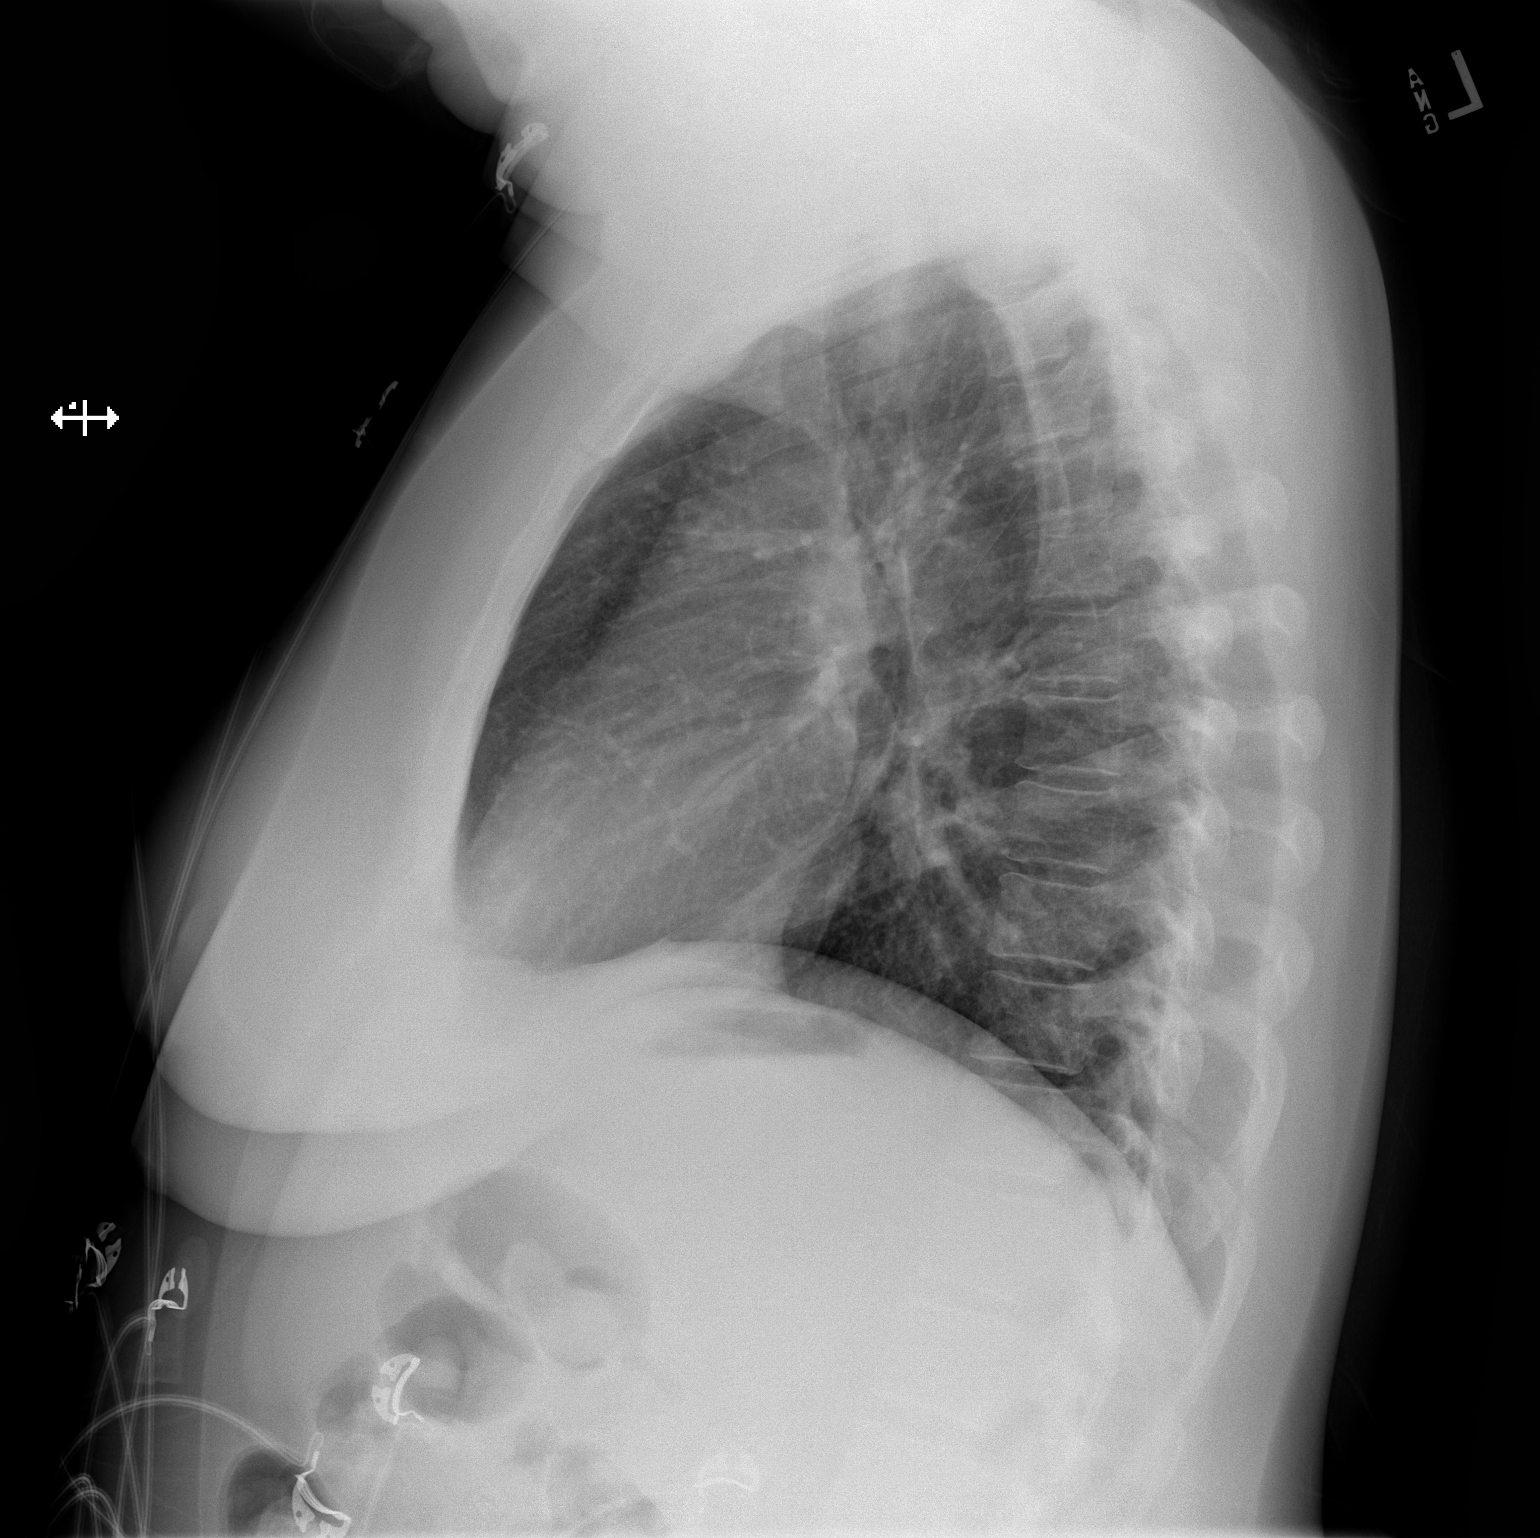

[2 of 2 positions shown; findings below may reference images not displayed]

FINDINGS: Decreased lung volumes are seen which is likely secondary to the
degree of patient inspiration. Subsequent vascular crowding is
noted. There is no evidence of acute infiltrate, pleural effusion or
pneumothorax. The heart size and mediastinal contours are within
normal limits. The visualized skeletal structures are unremarkable.
IMPRESSION: No active cardiopulmonary disease.

## 2021-02-03 IMAGING — CT CT ANGIO CHEST
2 of 6 series · 18 of 36 positions shown · IV contrast (omnipaque)
Comparison: None.

CLINICAL DATA: Tachycardia. Shob, rt arm tingling, left arm swollen
lymph nodes swollen, headache, generally not feeling well. Fever in
triage. Tachycardia Pt had a prior scan with iv contrast (75cc) at
10 pm last night. ^100mL OMNIPAQUE IOHEXOL 350 MG/ML SOLNShortness
of breath

EXAM:
CT ANGIOGRAPHY CHEST WITH CONTRAST
TECHNIQUE: Multidetector CT imaging of the chest was performed using the
standard protocol during bolus administration of intravenous
contrast. Multiplanar CT image reconstructions and MIPs were
obtained to evaluate the vascular anatomy.
CONTRAST:  100mL OMNIPAQUE IOHEXOL 350 MG/ML SOLN

[Series 5: thins · axial · 0.83mm/px · z∈[-284,-50]mm · 17 of 264 slices shown]
[im 15/264  lung]
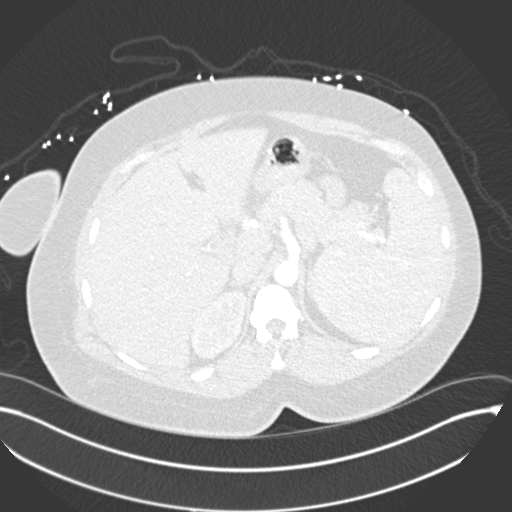
[im 30/264  mediastinal]
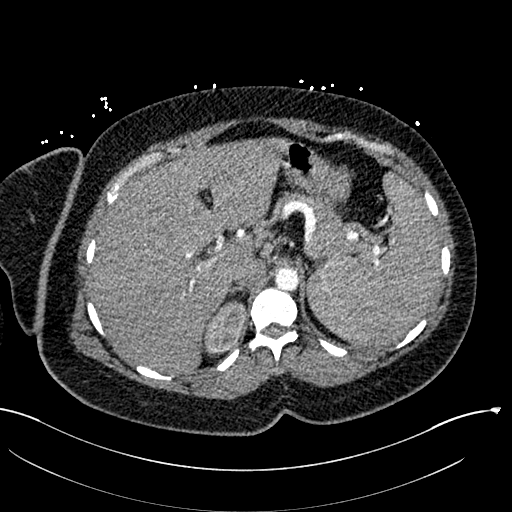
[im 44/264  lung]
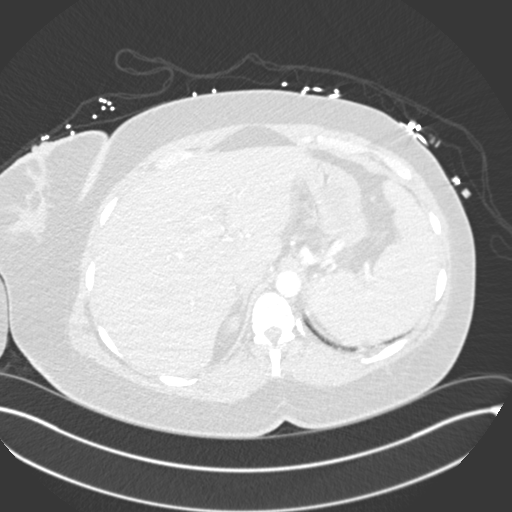
[im 59/264  mediastinal]
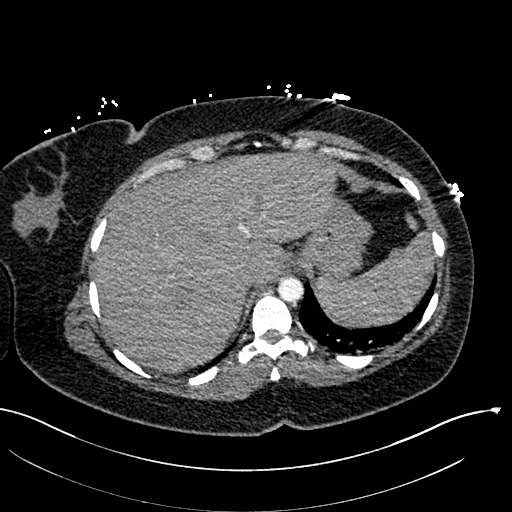
[im 74/264  lung]
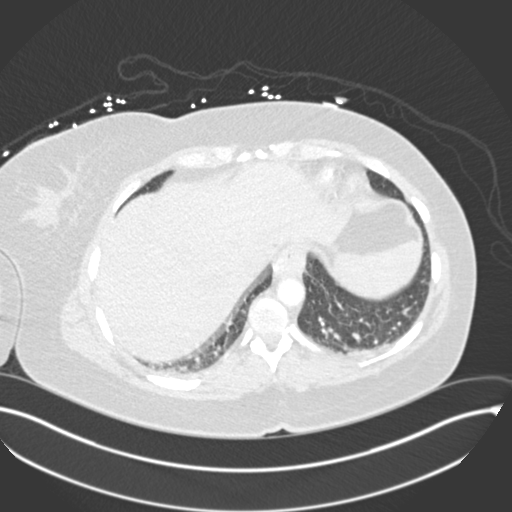
[im 88/264  mediastinal]
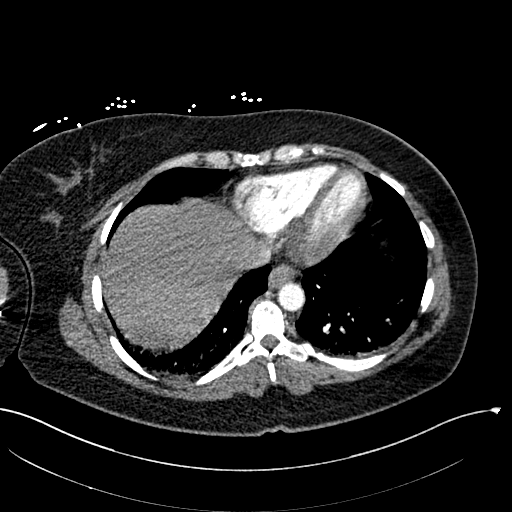
[im 103/264  lung]
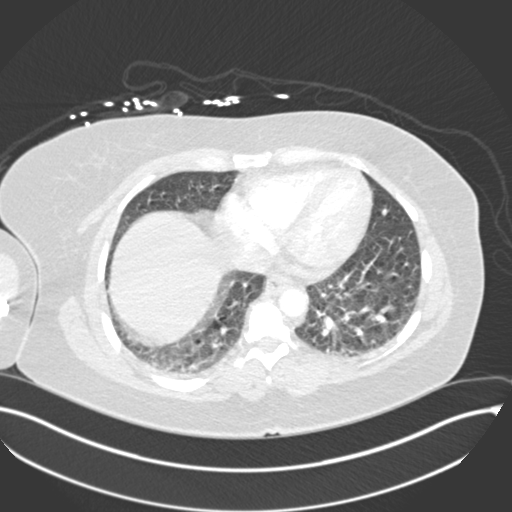
[im 117/264  mediastinal]
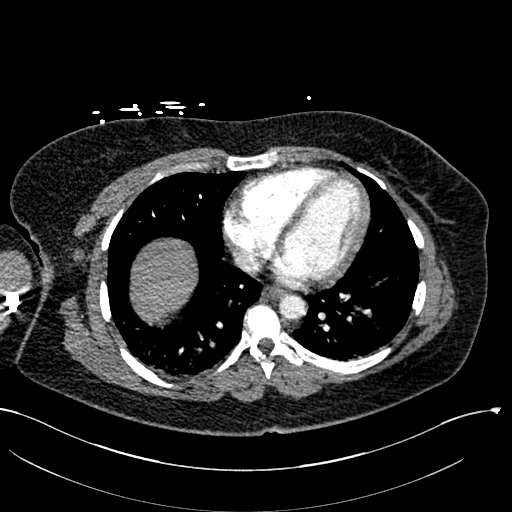
[im 132/264  lung]
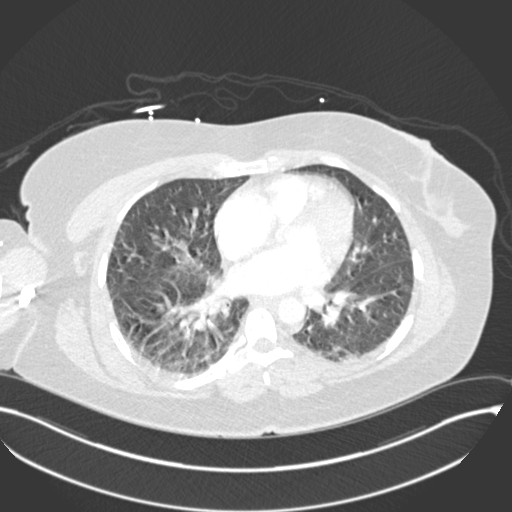
[im 147/264  mediastinal]
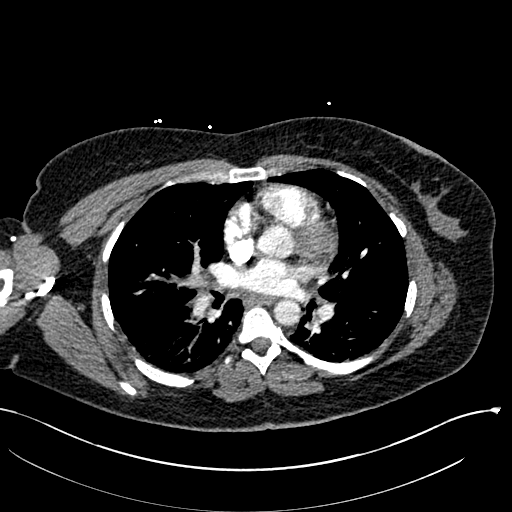
[im 161/264  lung]
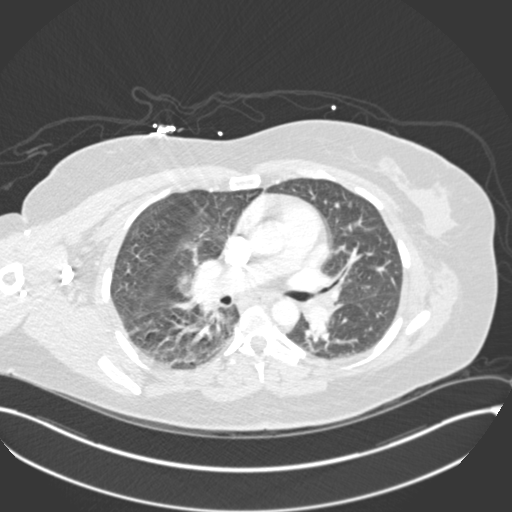
[im 176/264  mediastinal]
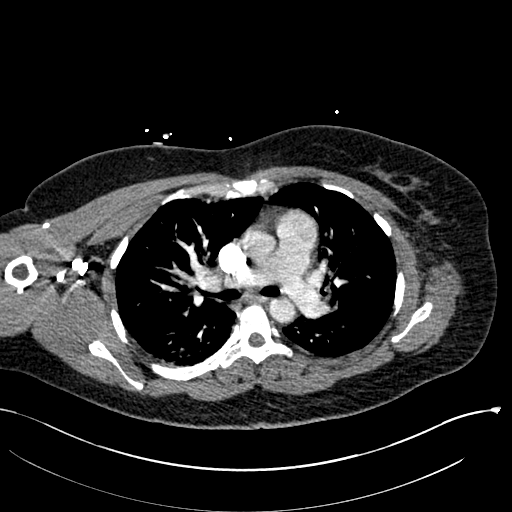
[im 190/264  lung]
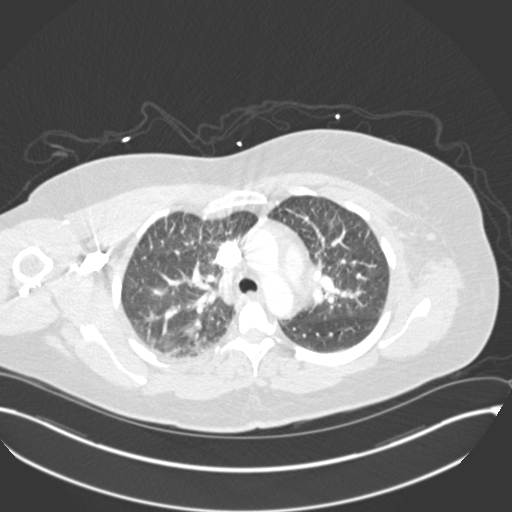
[im 205/264  mediastinal]
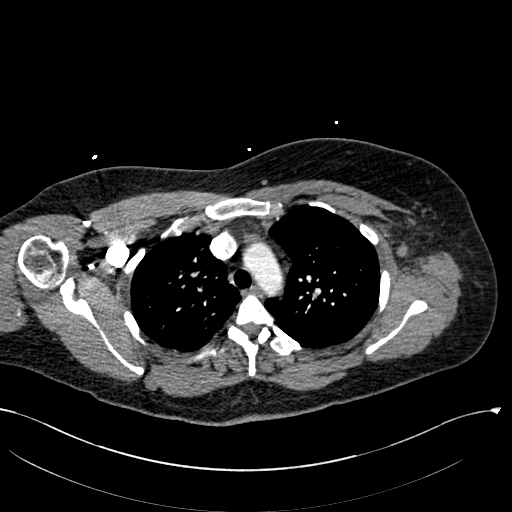
[im 220/264  lung]
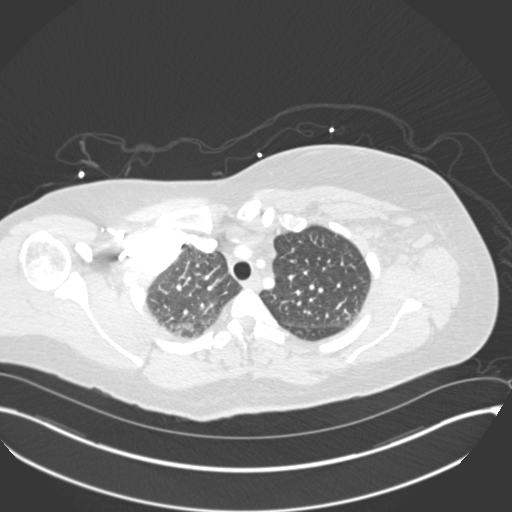
[im 234/264  mediastinal]
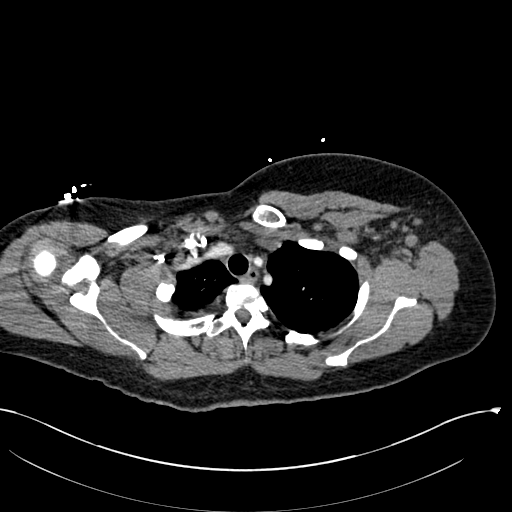
[im 249/264  lung]
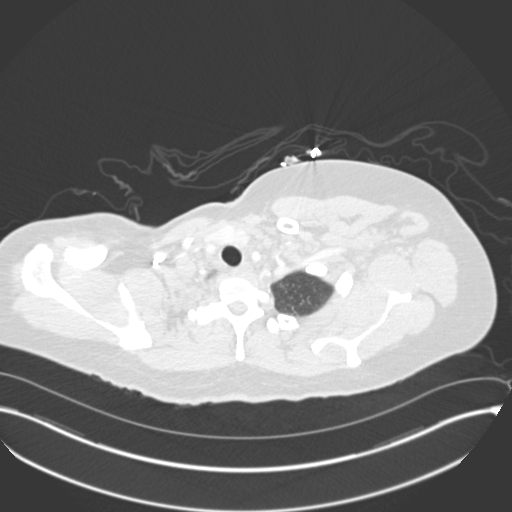

[Series 7: coronal mpr · coronal · 0.54mm/px · 1 of 151 slices shown]
[im 76/151  mediastinal]
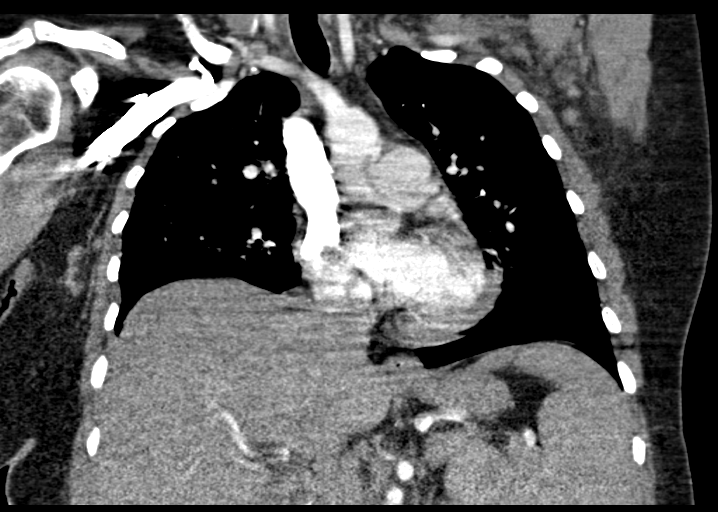

[18 of 36 positions shown; findings below may reference images not displayed]

FINDINGS: Cardiovascular: Exam is degraded by patient respiratory motion as
well as body habitus. No filling defects within the main pulmonary
arteries or proximal segmental pulmonary arteries. The distal
segmental pulmonary arteries and subsegmental arteries are not
evaluable.

No pericardial fluid.

Mediastinum/Nodes: Cluster of prominent LEFT axillary lymph nodes.
No mediastinal adenopathy.

Lungs/Pleura: Interstitial edema pattern. No infiltrate. No
pulmonary infarction.

Upper Abdomen: Limited view of the liver, kidneys, pancreas are
unremarkable. Normal adrenal glands.

Musculoskeletal: No aggressive osseous lesion.

Review of the MIP images confirms the above findings.
IMPRESSION: 1. No evidence acute pulmonary embolism in the main pulmonary
arteries or proximal segmental pulmonary arteries. The distal
segmental pulmonary arteries and subsegmental arteries are not
evaluable.
2. Interstitial edema pattern.
3. Cluster of prominent LEFT axillary lymph nodes with indeterminate
etiology. Query recent COVID vaccination.
